# Patient Record
Sex: Female | Born: 2000 | Race: Black or African American | Hispanic: No | Marital: Single | Smoking: Never smoker
Health system: Southern US, Community
[De-identification: ages and names within clinical notes are randomized; demographics above are authoritative.]

---
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
## 2019-01-28 DIAGNOSIS — Z0000 Encounter for general adult medical examination without abnormal findings: Secondary | ICD-10-CM | POA: Diagnosis not present

## 2019-01-28 DIAGNOSIS — Z68.41 Body mass index (BMI) pediatric, 85th percentile to less than 95th percentile for age: Secondary | ICD-10-CM | POA: Diagnosis not present

## 2019-01-28 DIAGNOSIS — Z713 Dietary counseling and surveillance: Secondary | ICD-10-CM | POA: Diagnosis not present

## 2021-01-16 ENCOUNTER — Ambulatory Visit: Payer: 59 | Attending: Internal Medicine

## 2021-01-16 ENCOUNTER — Other Ambulatory Visit (HOSPITAL_COMMUNITY): Payer: Self-pay | Admitting: Internal Medicine

## 2021-01-16 DIAGNOSIS — Z23 Encounter for immunization: Secondary | ICD-10-CM

## 2021-01-16 NOTE — Progress Notes (Signed)
   Covid-19 Vaccination Clinic  Name:  Patty Webster    MRN: [id number] DOB: 2001-06-03  01/16/2021  Patty Webster was observed post Covid-19 immunization for 15 minutes without incident. She was provided with Vaccine Information Sheet and instruction to access the V-Safe system.   Patty Webster was instructed to call 911 with any severe reactions post vaccine: Marland Kitchen Difficulty breathing  . Swelling of face and throat  . A fast heartbeat  . A bad rash all over body  . Dizziness and weakness   Immunizations Administered    Name Date Dose VIS Date Route   PFIZER Comrnaty(Gray TOP) Covid-19 Vaccine 01/16/2021  2:09 PM 0.3 mL 11/03/2020 Intramuscular   Manufacturer: [employer], [employer]   Lot: [id number]   NDC: [phone]

## 2021-01-17 MED FILL — PFIZER-BIONT COVID-19 VAC-T: 30 | 21 days supply | Qty: 0 | Fill #0

## 2021-07-23 ENCOUNTER — Telehealth: Payer: BC Managed Care – PPO | Admitting: Nurse Practitioner

## 2021-07-23 DIAGNOSIS — U071 COVID-19: Secondary | ICD-10-CM

## 2021-07-23 MED ORDER — PSEUDOEPH-BROMPHEN-DM 30-2-10 MG/5ML PO SYRP: 5.0000 mL | ORAL_SOLUTION | Freq: Four times a day (QID) | ORAL | 0 refills | Status: AC | PRN

## 2021-07-23 NOTE — Patient Instructions (Signed)
  Patty Webster, thank you for joining Nicoletta Ba, NP for today's virtual visit.  While this provider is not your primary care provider (PCP), if your PCP is located in our provider database this encounter information will be shared with them immediately following your visit.  Consent: (Patient) Patty Webster provided verbal consent for this virtual visit at the beginning of the encounter.  Current Medications:  Current Outpatient Medications:    brompheniramine-pseudoephedrine-DM 30-2-10 MG/5ML syrup, Take 5 mLs by mouth 4 (four) times daily as needed for up to 7 days., Disp: 140 mL, Rfl: 0   COVID-19 mRNA Vac-TriS, Pfizer, SUSP injection, INJECT AS DIRECTED, Disp: .3 mL, Rfl: 0   Medications ordered in this encounter:  Meds ordered this encounter  Medications   brompheniramine-pseudoephedrine-DM 30-2-10 MG/5ML syrup    Sig: Take 5 mLs by mouth 4 (four) times daily as needed for up to 7 days.    Dispense:  140 mL    Refill:  0     *If you need refills on other medications prior to your next appointment, please contact your pharmacy*  Follow-Up: Call back or seek an in-person evaluation if the symptoms worsen or if the condition fails to improve as anticipated.  Other Instructions Will provide symptomatic treatment today. Instructions provided regarding Ibuprofen and Tylenol use. Increase fluids, get plenty of rest. Discussed isolation recommendations and guidelines. Note provided for work. If symptoms worsen, follow up with providers or see PCP.   If you have been instructed to have an in-person evaluation today at a local Urgent Care facility, please use the link below. It will take you to a list of all of our available [facility], including address, phone number and hours of operation. Please do not delay care.  [facility]  If you or a family member do not have a primary care provider, use the link below to schedule a visit and establish care.  When you choose a [facility] primary care physician or advanced practice provider, you gain a long-term partner in health. Find a Primary Care Provider  Learn more about [facility]'s in-office and virtual care options: [facility] - Get Care Now

## 2021-07-23 NOTE — Progress Notes (Signed)
Virtual Visit Consent   [name], you are scheduled for a virtual visit with a [facility] provider today.     Just as with appointments in the office, your consent must be obtained to participate.  Your consent will be active for this visit and any virtual visit you may have with one of our providers in the next 365 days.     If you have a MyChart account, a copy of this consent can be sent to you electronically.  All virtual visits are billed to your insurance company just like a traditional visit in the office.    As this is a virtual visit, video technology does not allow for your provider to perform a traditional examination.  This may limit your provider's ability to fully assess your condition.  If your provider identifies any concerns that need to be evaluated in person or the need to arrange testing (such as labs, EKG, etc.), we will make arrangements to do so.     Although advances in technology are sophisticated, we cannot ensure that it will always work on either your end or our end.  If the connection with a video visit is poor, the visit may have to be switched to a telephone visit.  With either a video or telephone visit, we are not always able to ensure that we have a secure connection.     I need to obtain your verbal consent now.   Are you willing to proceed with your visit today? Yes   Patty Webster has provided verbal consent on 07/23/2021 for a virtual visit (video or telephone).   Nicoletta Ba, NP   Date: 07/23/2021 2:16 PM   Virtual Visit via Video Note   I, Nicoletta Ba, connected with  Casady Voshell  ([id number], Jun 29, 2001) on 07/23/21 at  2:00 PM EDT by a video-enabled telemedicine application and verified that I am speaking with the correct person using two identifiers.  Location: Patient: Virtual Visit Location Patient: Home Provider: Virtual Visit Location Provider: Home   I discussed the limitations of evaluation and management by telemedicine  and the availability of in person appointments. The patient expressed understanding and agreed to proceed.    History of Present Illness: Patty Webster is a 20 y.o. who identifies as a female who was assigned female at birth, and is being seen today for a positive COVID test. Complains of bodyaches, fever, chills, sore throat (6/10), mild cough, lightheadedness. Denies wheezing, SOB, GI symptoms. Took Emergen-C and HA relief medicine. She has been vaccinated and received one booster.Marland Kitchen  HPI: HPI  Problems: There are no problems to display for this patient.   Allergies: Not on File Medications:  Current Outpatient Medications:    brompheniramine-pseudoephedrine-DM 30-2-10 MG/5ML syrup, Take 5 mLs by mouth 4 (four) times daily as needed for up to 7 days., Disp: 140 mL, Rfl: 0   COVID-19 mRNA Vac-TriS, Pfizer, SUSP injection, INJECT AS DIRECTED, Disp: .3 mL, Rfl: 0  Observations/Objective: Patient is well-developed, well-nourished in no acute distress.  Resting comfortably at home.  Head is normocephalic, atraumatic.  No labored breathing.  Speech is clear and coherent with logical content.  Patient is alert and oriented at baseline.    Assessment and Plan: 1. COVID-19 - brompheniramine-pseudoephedrine-DM 30-2-10 MG/5ML syrup; Take 5 mLs by mouth 4 (four) times daily as needed for up to 7 days.  Dispense: 140 mL; Refill: 0  Will provide symptomatic treatment today, patient is not high risk and symptoms do not appear  to be severe.. Instructions provided regarding Ibuprofen and Tylenol use. Increase fluids, get plenty of rest. Discussed isolation recommendations and guidelines. Note provided for work. If symptoms worsen, follow up with providers or see PCP. Patient education provided regarding COVID-19. Patient verbalizes understanding, all questions answered.   Follow Up Instructions: I discussed the assessment and treatment plan with the patient. The patient was provided an opportunity to  ask questions and all were answered. The patient agreed with the plan and demonstrated an understanding of the instructions.  A copy of instructions were sent to the patient via MyChart.  The patient was advised to call back or seek an in-person evaluation if the symptoms worsen or if the condition fails to improve as anticipated.  Time:  I spent 20 minutes with the patient via telehealth technology discussing the above problems/concerns.    Nicoletta Ba, NP

## 2022-01-03 DIAGNOSIS — Z1339 Encounter for screening examination for other mental health and behavioral disorders: Secondary | ICD-10-CM | POA: Diagnosis not present

## 2022-01-03 DIAGNOSIS — Z1331 Encounter for screening for depression: Secondary | ICD-10-CM | POA: Diagnosis not present

## 2022-01-03 DIAGNOSIS — Z131 Encounter for screening for diabetes mellitus: Secondary | ICD-10-CM | POA: Diagnosis not present

## 2022-01-03 DIAGNOSIS — E8881 Metabolic syndrome: Secondary | ICD-10-CM | POA: Diagnosis not present

## 2022-01-03 DIAGNOSIS — Z711 Person with feared health complaint in whom no diagnosis is made: Secondary | ICD-10-CM | POA: Diagnosis not present

## 2022-01-03 DIAGNOSIS — E669 Obesity, unspecified: Secondary | ICD-10-CM | POA: Diagnosis not present

## 2022-01-03 DIAGNOSIS — Z79899 Other long term (current) drug therapy: Secondary | ICD-10-CM | POA: Diagnosis not present

## 2022-01-03 DIAGNOSIS — Z20822 Contact with and (suspected) exposure to covid-19: Secondary | ICD-10-CM | POA: Diagnosis not present

## 2022-01-03 DIAGNOSIS — R0602 Shortness of breath: Secondary | ICD-10-CM | POA: Diagnosis not present

## 2022-01-03 DIAGNOSIS — Z1159 Encounter for screening for other viral diseases: Secondary | ICD-10-CM | POA: Diagnosis not present

## 2022-01-03 DIAGNOSIS — Z114 Encounter for screening for human immunodeficiency virus [HIV]: Secondary | ICD-10-CM | POA: Diagnosis not present

## 2022-01-03 DIAGNOSIS — Z6833 Body mass index (BMI) 33.0-33.9, adult: Secondary | ICD-10-CM | POA: Diagnosis not present

## 2022-01-03 DIAGNOSIS — Z0000 Encounter for general adult medical examination without abnormal findings: Secondary | ICD-10-CM | POA: Diagnosis not present

## 2022-03-13 DIAGNOSIS — E8881 Metabolic syndrome: Secondary | ICD-10-CM | POA: Diagnosis not present

## 2022-03-13 DIAGNOSIS — Z6835 Body mass index (BMI) 35.0-35.9, adult: Secondary | ICD-10-CM | POA: Diagnosis not present

## 2022-03-13 DIAGNOSIS — R7302 Impaired glucose tolerance (oral): Secondary | ICD-10-CM | POA: Diagnosis not present

## 2022-03-13 DIAGNOSIS — E7800 Pure hypercholesterolemia, unspecified: Secondary | ICD-10-CM | POA: Diagnosis not present

## 2022-03-13 DIAGNOSIS — E669 Obesity, unspecified: Secondary | ICD-10-CM | POA: Diagnosis not present

## 2023-06-18 ENCOUNTER — Ambulatory Visit
Admission: RE | Admit: 2023-06-18 | Discharge: 2023-06-18 | Disposition: A | Discharge status: Home / Self Care | Payer: BC Managed Care – PPO | Source: Ambulatory Visit | Attending: Pediatrics | Admitting: Pediatrics

## 2023-06-18 VITALS — BP 116/81 | HR 75 | Temp 98.9°F | Resp 17

## 2023-06-18 DIAGNOSIS — M79641 Pain in right hand: Secondary | ICD-10-CM | POA: Diagnosis not present

## 2023-06-18 DIAGNOSIS — H938X2 Other specified disorders of left ear: Secondary | ICD-10-CM | POA: Diagnosis not present

## 2023-06-18 NOTE — ED Triage Notes (Signed)
Pt presents with c/o lt ear fullness and nerve pain X 3 days.   Pt states she has not taken anything for the pain.

## 2023-06-18 NOTE — ED Provider Notes (Signed)
[facility]    CSN: [id number] Arrival date & time: 06/18/23  1516      History   Chief Complaint Chief Complaint  Patient presents with   Ear Fullness    As well as nerve pain in hand. - Entered by patient    HPI Patty Webster is a 22 y.o. female.   Patient reports her left ear has been feeling full for the past month since she got water down in her ear.  Patient reports she is also had a stinging sensation in her right palm.  Patient does eat do repetitive fine motor work using her right hand.  Patient denies any fever denies any chills she is not having difficulty with hearing patient denies any injuries  The history is provided by the patient. No language interpreter was used.  Ear Fullness    History reviewed. No pertinent past medical history.  There are no problems to display for this patient.   History reviewed. No pertinent surgical history.  OB History   No obstetric history on file.      Home Medications    Prior to Admission medications   Not on File    Family History History reviewed. No pertinent family history.  Social History Social History   Tobacco Use   Smoking status: Never   Smokeless tobacco: Never  Substance Use Topics   Drug use: Never     Allergies   Patient has no known allergies.   Review of Systems Review of Systems  All other systems reviewed and are negative.    Physical Exam Triage Vital Signs ED Triage Vitals  Encounter Vitals Group     BP 06/18/23 1524 116/81     Systolic BP Percentile --      Diastolic BP Percentile --      Pulse Rate 06/18/23 1524 75     Resp 06/18/23 1524 17     Temp 06/18/23 1524 98.9 F (37.2 C)     Temp Source 06/18/23 1524 Oral     SpO2 06/18/23 1524 96 %     Weight --      Height --      Head Circumference --      Peak Flow --      Pain Score 06/18/23 1523 9     Pain Loc --      Pain Education --      Exclude from Growth Chart --    No data  found.  Updated Vital Signs BP 116/81 (BP Location: Right Arm)   Pulse 75   Temp 98.9 F (37.2 C) (Oral)   Resp 17   LMP 05/31/2023 (Exact Date)   SpO2 96%   Visual Acuity Right Eye Distance:   Left Eye Distance:   Bilateral Distance:    Right Eye Near:   Left Eye Near:    Bilateral Near:     Physical Exam Vitals and nursing note reviewed.  Constitutional:      Appearance: She is well-developed.  HENT:     Head: Normocephalic.     Right Ear: Tympanic membrane normal.     Left Ear: Tympanic membrane normal.     Ears:     Comments: Amount of fluid behind left TM landmarks visualized Cardiovascular:     Rate and Rhythm: Normal rate.  Pulmonary:     Effort: Pulmonary effort is normal.  Abdominal:     General: There is no distension.  Musculoskeletal:  General: Normal range of motion.     Cervical back: Normal range of motion.  Skin:    General: Skin is warm.  Neurological:     General: No focal deficit present.     Mental Status: She is alert and oriented to person, place, and time.  Psychiatric:        Mood and Affect: Mood normal.      UC Treatments / Results  Labs (all labs ordered are listed, but only abnormal results are displayed) Labs Reviewed - No data to display  EKG   Radiology No results found.  Procedures Procedures (including critical care time)  Medications Ordered in UC Medications - No data to display  Initial Impression / Assessment and Plan / UC Course  I have reviewed the triage vital signs and the nursing notes.  Pertinent labs & imaging results that were available during my care of the patient were reviewed by me and considered in my medical decision making (see chart for details).     Patient's symptoms are secondary to having COVID last week and she probably has some continued congestion causing her left ear to be full.  Right hand symptoms most likely related to repetitive use.  Patient is advised to try decongestants  ibuprofen she is to limit the use of her right hand for the next 2 to 3 days to see if this helps with her symptoms.  Patient is advised to follow-up with her primary care physician for recheck. Final Clinical Impressions(s) / UC Diagnoses   Final diagnoses:  Ear fullness, left  Hand pain, right     Discharge Instructions      Try taking a decongestant to help with ear fullness.  Decrease use of right hand    ED Prescriptions   None    PDMP not reviewed this encounter. An After Visit Summary was printed and given to the patient.       Elson Areas, [place] 06/18/23 1624

## 2023-06-18 NOTE — Discharge Instructions (Signed)
Try taking a decongestant to help with ear fullness.  Decrease use of right hand

## 2023-08-08 ENCOUNTER — Ambulatory Visit: Payer: Self-pay

## 2023-08-13 DIAGNOSIS — H6123 Impacted cerumen, bilateral: Secondary | ICD-10-CM | POA: Diagnosis not present

## 2023-08-13 DIAGNOSIS — H9312 Tinnitus, left ear: Secondary | ICD-10-CM | POA: Diagnosis not present

## 2023-08-13 DIAGNOSIS — Z6839 Body mass index (BMI) 39.0-39.9, adult: Secondary | ICD-10-CM | POA: Diagnosis not present

## 2023-09-11 DIAGNOSIS — Z131 Encounter for screening for diabetes mellitus: Secondary | ICD-10-CM | POA: Diagnosis not present

## 2023-09-11 DIAGNOSIS — Z0000 Encounter for general adult medical examination without abnormal findings: Secondary | ICD-10-CM | POA: Diagnosis not present

## 2024-05-22 ENCOUNTER — Ambulatory Visit: Payer: Self-pay

## 2024-11-06 DIAGNOSIS — S63642A Sprain of metacarpophalangeal joint of left thumb, initial encounter: Secondary | ICD-10-CM | POA: Diagnosis not present
